# Patient Record
Sex: Female | Born: 1995
Health system: Southern US, Community
[De-identification: ages and names within clinical notes are randomized; demographics above are authoritative.]

---
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
## 2014-12-26 ENCOUNTER — Ambulatory Visit: Payer: Self-pay | Admitting: Family Medicine

## 2015-03-21 ENCOUNTER — Telehealth: Payer: 59 | Admitting: Nurse Practitioner

## 2015-03-21 DIAGNOSIS — J0101 Acute recurrent maxillary sinusitis: Secondary | ICD-10-CM

## 2015-03-21 MED ORDER — AZITHROMYCIN 250 MG PO TABS: ORAL_TABLET | ORAL | Status: DC

## 2015-03-21 NOTE — Progress Notes (Signed)

## 2015-11-16 ENCOUNTER — Telehealth: Payer: Self-pay | Admitting: Family

## 2015-11-16 DIAGNOSIS — J069 Acute upper respiratory infection, unspecified: Secondary | ICD-10-CM

## 2015-11-16 MED ORDER — FLUTICASONE PROPIONATE 50 MCG/ACT NA SUSP: 2.0000 | Freq: Every day | NASAL | Status: AC

## 2015-11-16 MED ORDER — BENZONATATE 200 MG PO CAPS: 200.0000 mg | ORAL_CAPSULE | Freq: Three times a day (TID) | ORAL | Status: DC | PRN

## 2015-11-16 MED ORDER — AZITHROMYCIN 250 MG PO TABS: ORAL_TABLET | ORAL | Status: DC

## 2015-11-16 NOTE — Addendum Note (Signed)
Addended by: Jannifer RodneyHAWKS, Gyanna Jarema A on: 11/16/2015 08:22 PM   Modules accepted: Orders

## 2015-11-16 NOTE — Progress Notes (Signed)
We are sorry that you are not feeling well.  Here is how we plan to help!  Based on what you have shared with me it looks like you have upper respiratory tract inflammation that has resulted in a significant cough.  Inflammation and infection in the upper respiratory tract is commonly called bronchitis and has four common causes:  Allergies, Viral Infections, Acid Reflux and Bacterial Infections.  Allergies, viruses and acid reflux are treated by controlling symptoms or eliminating the cause. An example might be a cough caused by taking certain blood pressure medications. You stop the cough by changing the medication. Another example might be a cough caused by acid reflux. Controlling the reflux helps control the cough.  Based on your presentation I believe you most likely have A cough due to a virus.  This is called viral bronchitis and is best treated by rest, plenty of fluids and control of the cough.  You may use Ibuprofen or Tylenol as directed to help your symptoms.    In addition you may use A non-prescription cough medication called Robitussin DAC. Take 2 teaspoons every 8 hours or Delsym: take 2 teaspoons every 12 hours., A non-prescription cough medication called Mucinex DM: take 2 tablets every 12 hours. and A prescription cough medication called Tessalon Perles 100mg . You may take 1-2 capsules every 8 hours as needed for your cough. I also sent in a prescription for Flonase nasal spray.     HOME CARE . Only take medications as instructed by your medical team. . Complete the entire course of an antibiotic. . Drink plenty of fluids and get plenty of rest. . Avoid close contacts especially the very young and the elderly . Cover your mouth if you cough or cough into your sleeve. . Always remember to wash your hands . A steam or ultrasonic humidifier can help congestion.    GET HELP RIGHT AWAY IF: . You develop worsening fever. . You become short of breath . You cough up blood. . Your  symptoms persist after you have completed your treatment plan MAKE SURE YOU   Understand these instructions.  Will watch your condition.  Will get help right away if you are not doing well or get worse.  Your e-visit answers were reviewed by a board certified advanced clinical practitioner to complete your personal care plan.  Depending on the condition, your plan could have included both over the counter or prescription medications. If there is a problem please reply  once you have received a response from your provider. Your safety is important to us.  If you have drug allergies check your prescription carefully.    You can use MyChart to ask questions about today's visit, request a non-urgent call back, or ask for a work or school excuse for 24 hours related to this e-Visit. If it has been greater than 24 hours you will need to follow up with your provider, or enter a new e-Visit to address those concerns. You will get an e-mail in the next two days asking about your experience.  I hope that your e-visit has been valuable and will speed your recovery. Thank you for using e-visits.

## 2015-12-21 MED FILL — DOXYCYCLINE 100 MG TABLET: 100 | 90 days supply | Qty: 90 | Fill #0

## 2016-02-02 MED FILL — BOTOX 100 UNITS VIAL: 100 | 90 days supply | Qty: 1 | Fill #2

## 2016-02-04 DIAGNOSIS — R61 Generalized hyperhidrosis: Secondary | ICD-10-CM | POA: Diagnosis not present

## 2016-04-04 MED FILL — DOXYCYCLINE 100 MG TABLET: 100 | 90 days supply | Qty: 90 | Fill #1

## 2016-05-08 ENCOUNTER — Telehealth: Payer: Self-pay | Admitting: Family

## 2016-05-08 DIAGNOSIS — J019 Acute sinusitis, unspecified: Secondary | ICD-10-CM

## 2016-05-08 MED ORDER — AMOXICILLIN-POT CLAVULANATE 875-125 MG PO TABS: 1.0000 | ORAL_TABLET | Freq: Two times a day (BID) | ORAL | Status: DC

## 2016-05-08 NOTE — Progress Notes (Signed)

## 2016-06-17 DIAGNOSIS — L700 Acne vulgaris: Secondary | ICD-10-CM | POA: Diagnosis not present

## 2016-06-17 MED FILL — AMPICILLIN TR 500 MG CAP: 500 | 30 days supply | Qty: 60 | Fill #0

## 2016-07-11 MED FILL — HYDROCODON-APAP 5-325: 5-325 | 2 days supply | Qty: 20 | Fill #0

## 2016-07-21 MED FILL — AMPICILLIN TR 500 MG CAP: 500 | 90 days supply | Qty: 180 | Fill #0

## 2016-09-05 DIAGNOSIS — L74519 Primary focal hyperhidrosis, unspecified: Secondary | ICD-10-CM | POA: Diagnosis not present

## 2016-09-05 MED FILL — BOTOX 100 UNITS VIAL: 100 | 90 days supply | Qty: 1 | Fill #0

## 2016-10-25 MED FILL — AMPICILLIN TR 500 MG CAP: 500 | 90 days supply | Qty: 180 | Fill #1

## 2016-12-09 DIAGNOSIS — L700 Acne vulgaris: Secondary | ICD-10-CM | POA: Diagnosis not present

## 2017-02-03 MED FILL — BOTOX 100 UNITS VIAL: 100 | 30 days supply | Qty: 1 | Fill #0

## 2017-02-06 DIAGNOSIS — L74510 Primary focal hyperhidrosis, axilla: Secondary | ICD-10-CM | POA: Diagnosis not present

## 2017-02-07 MED FILL — AMPICILLIN TR 500 MG CAP: 500 | 30 days supply | Qty: 60 | Fill #1

## 2017-03-22 MED FILL — AMPICILLIN TR 500 MG CAP: 500 | 30 days supply | Qty: 60 | Fill #2

## 2017-04-24 MED FILL — AMPICILLIN TR 500 MG CAP: 500 | 90 days supply | Qty: 180 | Fill #0

## 2017-05-16 MED FILL — AMPICILLIN TR 500 MG CAP: 500 | 30 days supply | Qty: 60 | Fill #0

## 2017-06-13 DIAGNOSIS — L700 Acne vulgaris: Secondary | ICD-10-CM | POA: Diagnosis not present

## 2017-06-26 MED FILL — SPIRONOLACTONE 50 MG TABLET: 50 | 30 days supply | Qty: 30 | Fill #0

## 2017-06-26 MED FILL — AMPICILLIN TR 500 MG CAP: 500 | 30 days supply | Qty: 60 | Fill #0

## 2017-06-29 DIAGNOSIS — B370 Candidal stomatitis: Secondary | ICD-10-CM | POA: Diagnosis not present

## 2017-06-29 MED FILL — FLUCONAZOLE 150 MG TABLET: 150 | 7 days supply | Qty: 7 | Fill #0

## 2018-01-28 ENCOUNTER — Telehealth: Payer: Self-pay | Admitting: Family

## 2018-01-28 DIAGNOSIS — J111 Influenza due to unidentified influenza virus with other respiratory manifestations: Secondary | ICD-10-CM

## 2018-01-28 MED ORDER — OSELTAMIVIR PHOSPHATE 75 MG PO CAPS: 75.0000 mg | ORAL_CAPSULE | Freq: Two times a day (BID) | ORAL | 0 refills | Status: AC

## 2018-01-28 NOTE — Progress Notes (Signed)
Thank you for the details you included in the comment boxes. Those details are very helpful in determining the best course of treatment for you and help us to provide the best care.  E visit for Flu like symptoms   We are sorry that you are not feeling well.  Here is how we plan to help! Based on what you have shared with me it looks like you may have possible exposure to a virus that causes influenza.  Influenza or "the flu" is   an infection caused by a respiratory virus. The flu virus is highly contagious and persons who did not receive their yearly flu vaccination may "catch" the flu from close contact.  We have anti-viral medications to treat the viruses that cause this infection. They are not a "cure" and only shorten the course of the infection. These prescriptions are most effective when they are given within the first 2 days of "flu" symptoms. Antiviral medication are indicated if you have a high risk of complications from the flu. You should  also consider an antiviral medication if you are in close contact with someone who is at risk. These medications can help patients avoid complications from the flu  but have side effects that you should know. Possible side effects from Tamiflu or oseltamivir include nausea, vomiting, diarrhea, dizziness, headaches, eye redness, sleep problems or other respiratory symptoms. You should not take Tamiflu if you have an allergy to oseltamivir or any to the ingredients in Tamiflu.  Based upon your symptoms and potential risk factors I have prescribed Oseltamivir (Tamiflu).  It has been sent to your designated pharmacy.  You will take one 75 mg capsule orally twice a day for the next 5 days.  ANYONE WHO HAS FLU SYMPTOMS SHOULD: . Stay home. The flu is highly contagious and going out or to work exposes others! . Be sure to drink plenty of fluids. Water is fine as well as fruit juices, sodas and electrolyte beverages. You may want to stay away from caffeine or  alcohol. If you are nauseated, try taking small sips of liquids. How do you know if you are getting enough fluid? Your urine should be a pale yellow or almost colorless. . Get rest. . Taking a steamy shower or using a humidifier may help nasal congestion and ease sore throat pain. Using a saline nasal spray works much the same way. . Cough drops, hard candies and sore throat lozenges may ease your cough. . Line up a caregiver. Have someone check on you regularly.   GET HELP RIGHT AWAY IF: . You cannot keep down liquids or your medications. . You become short of breath . Your fell like you are going to pass out or loose consciousness. . Your symptoms persist after you have completed your treatment plan MAKE SURE YOU   Understand these instructions.  Will watch your condition.  Will get help right away if you are not doing well or get worse.  Your e-visit answers were reviewed by a board certified advanced clinical practitioner to complete your personal care plan.  Depending on the condition, your plan could have included both over the counter or prescription medications.  If there is a problem please reply  once you have received a response from your provider.  Your safety is important to us.  If you have drug allergies check your prescription carefully.    You can use MyChart to ask questions about today's visit, request a non-urgent call back, or ask   for a work or school excuse for 24 hours related to this e-Visit. If it has been greater than 24 hours you will need to follow up with your provider, or enter a new e-Visit to address those concerns.  You will get an e-mail in the next two days asking about your experience.  I hope that your e-visit has been valuable and will speed your recovery. Thank you for using e-visits.   

## 2018-02-12 MED FILL — BOTOX 100 UNITS VIAL: 100 | 30 days supply | Qty: 1 | Fill #0

## 2018-02-17 ENCOUNTER — Telehealth: Payer: Self-pay | Admitting: Nurse Practitioner

## 2018-02-17 DIAGNOSIS — R059 Cough, unspecified: Secondary | ICD-10-CM

## 2018-02-17 DIAGNOSIS — J069 Acute upper respiratory infection, unspecified: Secondary | ICD-10-CM

## 2018-02-17 DIAGNOSIS — R05 Cough: Secondary | ICD-10-CM

## 2018-02-17 MED ORDER — AZITHROMYCIN 250 MG PO TABS: ORAL_TABLET | ORAL | 0 refills | Status: DC

## 2018-02-17 MED ORDER — BENZONATATE 200 MG PO CAPS: 200.0000 mg | ORAL_CAPSULE | Freq: Three times a day (TID) | ORAL | 0 refills | Status: AC | PRN

## 2018-02-17 NOTE — Progress Notes (Signed)

## 2018-03-15 DIAGNOSIS — L74510 Primary focal hyperhidrosis, axilla: Secondary | ICD-10-CM | POA: Diagnosis not present

## 2018-04-07 DIAGNOSIS — Z23 Encounter for immunization: Secondary | ICD-10-CM | POA: Diagnosis not present

## 2018-04-07 DIAGNOSIS — Z020 Encounter for examination for admission to educational institution: Secondary | ICD-10-CM | POA: Diagnosis not present

## 2018-07-11 ENCOUNTER — Telehealth: Payer: Self-pay | Admitting: Family

## 2018-07-11 DIAGNOSIS — J019 Acute sinusitis, unspecified: Secondary | ICD-10-CM

## 2018-07-11 MED ORDER — AMOXICILLIN-POT CLAVULANATE 875-125 MG PO TABS: 1.0000 | ORAL_TABLET | Freq: Two times a day (BID) | ORAL | 0 refills | Status: DC

## 2018-07-11 NOTE — Progress Notes (Signed)

## 2018-09-11 ENCOUNTER — Telehealth: Payer: Self-pay | Admitting: Family

## 2018-09-11 DIAGNOSIS — B9689 Other specified bacterial agents as the cause of diseases classified elsewhere: Secondary | ICD-10-CM

## 2018-09-11 DIAGNOSIS — J019 Acute sinusitis, unspecified: Secondary | ICD-10-CM

## 2018-09-11 MED ORDER — AMOXICILLIN-POT CLAVULANATE 875-125 MG PO TABS: 1.0000 | ORAL_TABLET | Freq: Two times a day (BID) | ORAL | 0 refills | Status: DC

## 2018-09-11 NOTE — Progress Notes (Signed)

## 2018-09-19 DIAGNOSIS — Z111 Encounter for screening for respiratory tuberculosis: Secondary | ICD-10-CM | POA: Diagnosis not present

## 2018-09-22 DIAGNOSIS — Z111 Encounter for screening for respiratory tuberculosis: Secondary | ICD-10-CM | POA: Diagnosis not present

## 2018-09-26 DIAGNOSIS — Z111 Encounter for screening for respiratory tuberculosis: Secondary | ICD-10-CM | POA: Diagnosis not present

## 2018-09-29 DIAGNOSIS — Z111 Encounter for screening for respiratory tuberculosis: Secondary | ICD-10-CM | POA: Diagnosis not present

## 2018-10-20 DIAGNOSIS — F411 Generalized anxiety disorder: Secondary | ICD-10-CM | POA: Diagnosis not present

## 2018-10-20 DIAGNOSIS — F909 Attention-deficit hyperactivity disorder, unspecified type: Secondary | ICD-10-CM | POA: Diagnosis not present

## 2018-10-20 DIAGNOSIS — Z600 Problems of adjustment to life-cycle transitions: Secondary | ICD-10-CM | POA: Diagnosis not present

## 2018-10-22 DIAGNOSIS — F909 Attention-deficit hyperactivity disorder, unspecified type: Secondary | ICD-10-CM | POA: Diagnosis not present

## 2018-10-22 DIAGNOSIS — F411 Generalized anxiety disorder: Secondary | ICD-10-CM | POA: Diagnosis not present

## 2018-10-22 DIAGNOSIS — Z600 Problems of adjustment to life-cycle transitions: Secondary | ICD-10-CM | POA: Diagnosis not present

## 2018-10-31 DIAGNOSIS — F411 Generalized anxiety disorder: Secondary | ICD-10-CM | POA: Diagnosis not present

## 2018-10-31 DIAGNOSIS — F909 Attention-deficit hyperactivity disorder, unspecified type: Secondary | ICD-10-CM | POA: Diagnosis not present

## 2018-10-31 DIAGNOSIS — Z600 Problems of adjustment to life-cycle transitions: Secondary | ICD-10-CM | POA: Diagnosis not present

## 2018-11-02 ENCOUNTER — Telehealth: Payer: Self-pay | Admitting: Nurse Practitioner

## 2018-11-02 DIAGNOSIS — J020 Streptococcal pharyngitis: Secondary | ICD-10-CM

## 2018-11-02 MED ORDER — AMOXICILLIN-POT CLAVULANATE 875-125 MG PO TABS: 1.0000 | ORAL_TABLET | Freq: Two times a day (BID) | ORAL | 0 refills | Status: AC

## 2018-11-02 NOTE — Progress Notes (Signed)

## 2018-11-14 DIAGNOSIS — F419 Anxiety disorder, unspecified: Secondary | ICD-10-CM | POA: Diagnosis not present

## 2018-11-14 DIAGNOSIS — F900 Attention-deficit hyperactivity disorder, predominantly inattentive type: Secondary | ICD-10-CM | POA: Diagnosis not present

## 2018-11-14 MED FILL — VYVANSE 20 MG CHEW: 20 | 30 days supply | Qty: 30 | Fill #0

## 2018-11-27 DIAGNOSIS — Z600 Problems of adjustment to life-cycle transitions: Secondary | ICD-10-CM | POA: Diagnosis not present

## 2018-11-27 DIAGNOSIS — F411 Generalized anxiety disorder: Secondary | ICD-10-CM | POA: Diagnosis not present

## 2018-11-27 DIAGNOSIS — F909 Attention-deficit hyperactivity disorder, unspecified type: Secondary | ICD-10-CM | POA: Diagnosis not present

## 2018-12-04 DIAGNOSIS — L700 Acne vulgaris: Secondary | ICD-10-CM | POA: Diagnosis not present

## 2018-12-04 MED FILL — AMPICILLIN TR 500 MG CAP: 500 | 30 days supply | Qty: 60 | Fill #0

## 2018-12-04 MED FILL — SPIRONOLACTONE 50 MG TABLET: 50 | 30 days supply | Qty: 30 | Fill #0

## 2018-12-13 ENCOUNTER — Telehealth: Payer: 59 | Admitting: Physician Assistant

## 2018-12-13 DIAGNOSIS — J069 Acute upper respiratory infection, unspecified: Secondary | ICD-10-CM | POA: Diagnosis not present

## 2018-12-13 NOTE — Progress Notes (Signed)
We are sorry you are not feeling well.  Here is how we plan to help!  Based on what you have shared with me, it looks like you may have a viral upper respiratory infection or a "common cold".  Symptoms do not seem consistent for a flu-like illness.  Colds are caused by a large number of viruses; however, rhinovirus is the most common cause.   Symptoms of the common cold vary from person to person, with common symptoms including sore throat, cough, and malaise.  A low-grade fever of 100.4 may present, but is often uncommon.  Symptoms vary however, and are closely related to a person's age or underlying illnesses.  The most common symptoms associated with the common cold are nasal discharge or congestion, cough, sneezing, headache and pressure in the ears and face.  Cold symptoms usually persist for about 3 to 10 days, but can last up to 2 weeks.  It is important to know that colds do not cause serious illness or complications in most cases.    The common cold is transmitted from person to person, with the most common method of transmission being a person's hands.  The virus is able to live on the skin and can infect other persons for up to 2 hours after direct contact.  Also, colds are transmitted when someone coughs or sneezes; thus, it is important to cover the mouth to reduce this risk.  To keep the spread of the common cold at bay, good hand hygiene is very important.  This is an infection that is most likely caused by a virus. There are no specific treatments for the common cold other than to help you with the symptoms until the infection runs its course.    For nasal congestion, you may use an oral decongestants such as Mucinex D or if you have glaucoma or high blood pressure use plain Mucinex.  Saline nasal spray or nasal drops can help and can safely be used as often as needed for congestion.  If you do not have a history of heart disease, hypertension, diabetes or thyroid disease,  prostate/bladder issues or glaucoma, you may also use Sudafed to treat nasal congestion.  It is highly recommended that you consult with a pharmacist or your primary care physician to ensure this medication is safe for you to take.     If you have a cough, you may use cough suppressants such as Delsym and Robitussin.  If you have glaucoma or high blood pressure, you can also use Coricidin HBP.     If you have a sore or scratchy throat, use a saltwater gargle-  to  teaspoon of salt dissolved in a 4-ounce to 8-ounce glass of warm water.  Gargle the solution for approximately 15-30 seconds and then spit.  It is important not to swallow the solution.  You can also use throat lozenges/cough drops and Chloraseptic spray to help with throat pain or discomfort.  Warm or cold liquids can also be helpful in relieving throat pain.  For headache, pain or general discomfort, you can use Ibuprofen or Tylenol as directed.   Some authorities believe that zinc sprays or the use of Echinacea may shorten the course of your symptoms.   HOME CARE . Only take medications as instructed by your medical team. . Be sure to drink plenty of fluids. Water is fine as well as fruit juices, sodas and electrolyte beverages. You may want to stay away from caffeine or alcohol. If you are  nauseated, try taking small sips of liquids. How do you know if you are getting enough fluid? Your urine should be a pale yellow or almost colorless. . Get rest. . Taking a steamy shower or using a humidifier may help nasal congestion and ease sore throat pain. You can place a towel over your head and breathe in the steam from hot water coming from a faucet. . Using a saline nasal spray works much the same way. . Cough drops, hard candies and sore throat lozenges may ease your cough. . Avoid close contacts especially the very young and the elderly . Cover your mouth if you cough or sneeze . Always remember to wash your hands.   GET HELP RIGHT  AWAY IF: . You develop worsening fever. . If your symptoms do not improve within 10 days . You develop yellow or green discharge from your nose over 3 days. . You have coughing fits . You develop a severe head ache or visual changes. . You develop shortness of breath, difficulty breathing or start having chest pain . Your symptoms persist after you have completed your treatment plan  MAKE SURE YOU   Understand these instructions.  Will watch your condition.  Will get help right away if you are not doing well or get worse.  Your e-visit answers were reviewed by a board certified advanced clinical practitioner to complete your personal care plan. Depending upon the condition, your plan could have included both over the counter or prescription medications. Please review your pharmacy choice. If there is a problem, you may call our nursing hot line at and have the prescription routed to another pharmacy. Your safety is important to us. If you have drug allergies check your prescription carefully.   You can use MyChart to ask questions about today's visit, request a non-urgent call back, or ask for a work or school excuse for 24 hours related to this e-Visit. If it has been greater than 24 hours you will need to follow up with your provider, or enter a new e-Visit to address those concerns. You will get an e-mail in the next two days asking about your experience.  I hope that your e-visit has been valuable and will speed your recovery. Thank you for using e-visits.

## 2018-12-13 NOTE — Progress Notes (Signed)
Message sent to patient requesting further information regarding current symptoms.

## 2018-12-20 DIAGNOSIS — F419 Anxiety disorder, unspecified: Secondary | ICD-10-CM | POA: Diagnosis not present

## 2018-12-20 DIAGNOSIS — Z79899 Other long term (current) drug therapy: Secondary | ICD-10-CM | POA: Diagnosis not present

## 2018-12-20 DIAGNOSIS — F900 Attention-deficit hyperactivity disorder, predominantly inattentive type: Secondary | ICD-10-CM | POA: Diagnosis not present

## 2018-12-20 MED FILL — VYVANSE 20 MG CHEW: 20 | 30 days supply | Qty: 30 | Fill #0

## 2018-12-25 DIAGNOSIS — Z600 Problems of adjustment to life-cycle transitions: Secondary | ICD-10-CM | POA: Diagnosis not present

## 2018-12-25 DIAGNOSIS — F909 Attention-deficit hyperactivity disorder, unspecified type: Secondary | ICD-10-CM | POA: Diagnosis not present

## 2018-12-25 DIAGNOSIS — F411 Generalized anxiety disorder: Secondary | ICD-10-CM | POA: Diagnosis not present

## 2019-01-16 DIAGNOSIS — F411 Generalized anxiety disorder: Secondary | ICD-10-CM | POA: Diagnosis not present

## 2019-01-16 DIAGNOSIS — F909 Attention-deficit hyperactivity disorder, unspecified type: Secondary | ICD-10-CM | POA: Diagnosis not present

## 2019-01-16 DIAGNOSIS — Z600 Problems of adjustment to life-cycle transitions: Secondary | ICD-10-CM | POA: Diagnosis not present

## 2019-01-16 DIAGNOSIS — Z62811 Personal history of psychological abuse in childhood: Secondary | ICD-10-CM | POA: Diagnosis not present

## 2019-01-30 DIAGNOSIS — Z62811 Personal history of psychological abuse in childhood: Secondary | ICD-10-CM | POA: Diagnosis not present

## 2019-01-30 DIAGNOSIS — F411 Generalized anxiety disorder: Secondary | ICD-10-CM | POA: Diagnosis not present

## 2019-01-30 DIAGNOSIS — F909 Attention-deficit hyperactivity disorder, unspecified type: Secondary | ICD-10-CM | POA: Diagnosis not present

## 2019-01-30 DIAGNOSIS — Z600 Problems of adjustment to life-cycle transitions: Secondary | ICD-10-CM | POA: Diagnosis not present

## 2019-02-14 DIAGNOSIS — Z62811 Personal history of psychological abuse in childhood: Secondary | ICD-10-CM | POA: Diagnosis not present

## 2019-02-14 DIAGNOSIS — F909 Attention-deficit hyperactivity disorder, unspecified type: Secondary | ICD-10-CM | POA: Diagnosis not present

## 2019-02-14 DIAGNOSIS — Z600 Problems of adjustment to life-cycle transitions: Secondary | ICD-10-CM | POA: Diagnosis not present

## 2019-02-14 DIAGNOSIS — F411 Generalized anxiety disorder: Secondary | ICD-10-CM | POA: Diagnosis not present

## 2019-03-05 DIAGNOSIS — L700 Acne vulgaris: Secondary | ICD-10-CM | POA: Diagnosis not present

## 2019-03-05 MED FILL — SPIRONOLACTONE 50 MG TABLET: 50 | 90 days supply | Qty: 180 | Fill #0

## 2019-03-29 DIAGNOSIS — Z600 Problems of adjustment to life-cycle transitions: Secondary | ICD-10-CM | POA: Diagnosis not present

## 2019-03-29 DIAGNOSIS — F909 Attention-deficit hyperactivity disorder, unspecified type: Secondary | ICD-10-CM | POA: Diagnosis not present

## 2019-03-29 DIAGNOSIS — F411 Generalized anxiety disorder: Secondary | ICD-10-CM | POA: Diagnosis not present

## 2019-03-29 DIAGNOSIS — Z62811 Personal history of psychological abuse in childhood: Secondary | ICD-10-CM | POA: Diagnosis not present

## 2019-04-11 DIAGNOSIS — F909 Attention-deficit hyperactivity disorder, unspecified type: Secondary | ICD-10-CM | POA: Diagnosis not present

## 2019-04-11 DIAGNOSIS — Z600 Problems of adjustment to life-cycle transitions: Secondary | ICD-10-CM | POA: Diagnosis not present

## 2019-04-11 DIAGNOSIS — F411 Generalized anxiety disorder: Secondary | ICD-10-CM | POA: Diagnosis not present

## 2019-04-11 DIAGNOSIS — Z62811 Personal history of psychological abuse in childhood: Secondary | ICD-10-CM | POA: Diagnosis not present

## 2019-05-17 DIAGNOSIS — Z600 Problems of adjustment to life-cycle transitions: Secondary | ICD-10-CM | POA: Diagnosis not present

## 2019-05-17 DIAGNOSIS — Z62811 Personal history of psychological abuse in childhood: Secondary | ICD-10-CM | POA: Diagnosis not present

## 2019-05-17 DIAGNOSIS — F411 Generalized anxiety disorder: Secondary | ICD-10-CM | POA: Diagnosis not present

## 2019-05-17 DIAGNOSIS — F909 Attention-deficit hyperactivity disorder, unspecified type: Secondary | ICD-10-CM | POA: Diagnosis not present

## 2019-06-28 DIAGNOSIS — Z600 Problems of adjustment to life-cycle transitions: Secondary | ICD-10-CM | POA: Diagnosis not present

## 2019-06-28 DIAGNOSIS — F909 Attention-deficit hyperactivity disorder, unspecified type: Secondary | ICD-10-CM | POA: Diagnosis not present

## 2019-06-28 DIAGNOSIS — Z62811 Personal history of psychological abuse in childhood: Secondary | ICD-10-CM | POA: Diagnosis not present

## 2019-06-28 DIAGNOSIS — F411 Generalized anxiety disorder: Secondary | ICD-10-CM | POA: Diagnosis not present

## 2019-07-12 DIAGNOSIS — F909 Attention-deficit hyperactivity disorder, unspecified type: Secondary | ICD-10-CM | POA: Diagnosis not present

## 2019-07-12 DIAGNOSIS — F411 Generalized anxiety disorder: Secondary | ICD-10-CM | POA: Diagnosis not present

## 2019-07-12 DIAGNOSIS — Z62811 Personal history of psychological abuse in childhood: Secondary | ICD-10-CM | POA: Diagnosis not present

## 2019-07-12 DIAGNOSIS — Z600 Problems of adjustment to life-cycle transitions: Secondary | ICD-10-CM | POA: Diagnosis not present

## 2019-08-05 DIAGNOSIS — F4322 Adjustment disorder with anxiety: Secondary | ICD-10-CM | POA: Diagnosis not present

## 2019-08-15 DIAGNOSIS — F4322 Adjustment disorder with anxiety: Secondary | ICD-10-CM | POA: Diagnosis not present

## 2019-08-23 DIAGNOSIS — F4322 Adjustment disorder with anxiety: Secondary | ICD-10-CM | POA: Diagnosis not present

## 2019-09-06 DIAGNOSIS — F4322 Adjustment disorder with anxiety: Secondary | ICD-10-CM | POA: Diagnosis not present

## 2019-09-11 DIAGNOSIS — L578 Other skin changes due to chronic exposure to nonionizing radiation: Secondary | ICD-10-CM | POA: Diagnosis not present

## 2019-09-11 DIAGNOSIS — L700 Acne vulgaris: Secondary | ICD-10-CM | POA: Diagnosis not present

## 2019-09-20 DIAGNOSIS — F4322 Adjustment disorder with anxiety: Secondary | ICD-10-CM | POA: Diagnosis not present

## 2019-10-04 DIAGNOSIS — F4322 Adjustment disorder with anxiety: Secondary | ICD-10-CM | POA: Diagnosis not present

## 2019-10-18 DIAGNOSIS — F4322 Adjustment disorder with anxiety: Secondary | ICD-10-CM | POA: Diagnosis not present

## 2019-11-01 DIAGNOSIS — F4322 Adjustment disorder with anxiety: Secondary | ICD-10-CM | POA: Diagnosis not present

## 2019-11-22 DIAGNOSIS — F4322 Adjustment disorder with anxiety: Secondary | ICD-10-CM | POA: Diagnosis not present

## 2019-12-03 DIAGNOSIS — F4322 Adjustment disorder with anxiety: Secondary | ICD-10-CM | POA: Diagnosis not present

## 2019-12-18 DIAGNOSIS — F4322 Adjustment disorder with anxiety: Secondary | ICD-10-CM | POA: Diagnosis not present

## 2020-01-06 DIAGNOSIS — F4322 Adjustment disorder with anxiety: Secondary | ICD-10-CM | POA: Diagnosis not present

## 2020-01-09 DIAGNOSIS — L700 Acne vulgaris: Secondary | ICD-10-CM | POA: Diagnosis not present

## 2020-01-28 MED FILL — SPIRONOLACTONE 50 MG TABS: 50 | 90 days supply | Qty: 180 | Fill #1

## 2020-02-01 DIAGNOSIS — F4322 Adjustment disorder with anxiety: Secondary | ICD-10-CM | POA: Diagnosis not present

## 2020-02-02 DIAGNOSIS — Z111 Encounter for screening for respiratory tuberculosis: Secondary | ICD-10-CM | POA: Diagnosis not present

## 2020-02-15 DIAGNOSIS — F4322 Adjustment disorder with anxiety: Secondary | ICD-10-CM | POA: Diagnosis not present

## 2020-02-29 DIAGNOSIS — F4322 Adjustment disorder with anxiety: Secondary | ICD-10-CM | POA: Diagnosis not present

## 2020-03-27 DIAGNOSIS — Z111 Encounter for screening for respiratory tuberculosis: Secondary | ICD-10-CM | POA: Diagnosis not present

## 2020-03-28 DIAGNOSIS — F4322 Adjustment disorder with anxiety: Secondary | ICD-10-CM | POA: Diagnosis not present

## 2020-03-29 DIAGNOSIS — Z111 Encounter for screening for respiratory tuberculosis: Secondary | ICD-10-CM | POA: Diagnosis not present

## 2020-04-07 DIAGNOSIS — F4322 Adjustment disorder with anxiety: Secondary | ICD-10-CM | POA: Diagnosis not present

## 2020-04-09 MED FILL — VYVANSE 20 MG CHEW: 20 | 30 days supply | Qty: 30 | Fill #0

## 2020-04-25 DIAGNOSIS — F4322 Adjustment disorder with anxiety: Secondary | ICD-10-CM | POA: Diagnosis not present

## 2020-05-09 DIAGNOSIS — F4322 Adjustment disorder with anxiety: Secondary | ICD-10-CM | POA: Diagnosis not present

## 2020-05-25 DIAGNOSIS — F4322 Adjustment disorder with anxiety: Secondary | ICD-10-CM | POA: Diagnosis not present

## 2020-06-08 DIAGNOSIS — F4322 Adjustment disorder with anxiety: Secondary | ICD-10-CM | POA: Diagnosis not present

## 2020-06-29 DIAGNOSIS — F4322 Adjustment disorder with anxiety: Secondary | ICD-10-CM | POA: Diagnosis not present

## 2020-07-14 DIAGNOSIS — F4322 Adjustment disorder with anxiety: Secondary | ICD-10-CM | POA: Diagnosis not present

## 2020-08-03 DIAGNOSIS — F4322 Adjustment disorder with anxiety: Secondary | ICD-10-CM | POA: Diagnosis not present

## 2020-08-06 DIAGNOSIS — Z79899 Other long term (current) drug therapy: Secondary | ICD-10-CM | POA: Diagnosis not present

## 2020-08-06 DIAGNOSIS — L700 Acne vulgaris: Secondary | ICD-10-CM | POA: Diagnosis not present

## 2020-08-27 DIAGNOSIS — F4322 Adjustment disorder with anxiety: Secondary | ICD-10-CM | POA: Diagnosis not present

## 2020-09-22 DIAGNOSIS — F4322 Adjustment disorder with anxiety: Secondary | ICD-10-CM | POA: Diagnosis not present

## 2020-10-15 DIAGNOSIS — F4322 Adjustment disorder with anxiety: Secondary | ICD-10-CM | POA: Diagnosis not present

## 2020-10-21 DIAGNOSIS — Z20822 Contact with and (suspected) exposure to covid-19: Secondary | ICD-10-CM | POA: Diagnosis not present

## 2021-01-27 DIAGNOSIS — L700 Acne vulgaris: Secondary | ICD-10-CM | POA: Diagnosis not present

## 2021-08-04 DIAGNOSIS — Z23 Encounter for immunization: Secondary | ICD-10-CM | POA: Diagnosis not present

## 2021-10-08 DIAGNOSIS — Z23 Encounter for immunization: Secondary | ICD-10-CM | POA: Diagnosis not present
